# Patient Record
Sex: Male | Born: 1950 | Race: White | Hispanic: No | Marital: Married
Health system: Southern US, Community
[De-identification: ages and names within clinical notes are randomized; demographics above are authoritative.]

---

## 2002-03-21 ENCOUNTER — Encounter: Payer: Self-pay | Admitting: Family Medicine

## 2002-03-21 ENCOUNTER — Encounter: Admission: RE | Admit: 2002-03-21 | Discharge: 2002-03-21 | Payer: Self-pay | Admitting: Family Medicine

## 2006-08-25 ENCOUNTER — Encounter: Admission: RE | Admit: 2006-08-25 | Discharge: 2006-08-25 | Payer: Self-pay | Admitting: Family Medicine

## 2009-01-05 ENCOUNTER — Encounter: Admission: RE | Admit: 2009-01-05 | Discharge: 2009-01-05 | Payer: Self-pay | Admitting: Occupational Medicine

## 2009-06-01 ENCOUNTER — Ambulatory Visit: Payer: Self-pay | Admitting: Sports Medicine

## 2009-06-01 DIAGNOSIS — M2140 Flat foot [pes planus] (acquired), unspecified foot: Secondary | ICD-10-CM | POA: Insufficient documentation

## 2009-06-01 DIAGNOSIS — R269 Unspecified abnormalities of gait and mobility: Secondary | ICD-10-CM

## 2009-06-01 DIAGNOSIS — M722 Plantar fascial fibromatosis: Secondary | ICD-10-CM

## 2009-07-13 ENCOUNTER — Ambulatory Visit: Payer: Self-pay | Admitting: Sports Medicine

## 2010-06-15 NOTE — Letter (Signed)
Summary: Generic Letter  [facility]  [address]   [place], [place] [address]   Phone: [phone]  Fax: [phone]    07/13/2009 [name], MD [facility] Fax  [phone]  Dear [name]:  Re: your patient Anthony Griffith [address] [place], [place]  [address]  Anthony Griffith did much better with temporary inserts so we made him custom orthotics today.  He will return to you but we will be happy to see him as needed.      Sincerely,  Vincent Gros MD

## 2010-06-15 NOTE — Letter (Signed)
Summary: *Consult Note  [facility]  [address]   [place], [place] [address]   Phone: [phone]  Fax: [phone]    Re:    Anthony Griffith DOB:    [birth year] [name], MD [facility] 06/01/09   Dear [name]:    Thank you for requesting that we see the above patient for consultation. 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  A copy of the detailed office note will be sent under separate cover, for your review.  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  We will encourage him to continue standard PF exercises.  With his pronated gait I think he would be benefited by custom support if this trial works.   New Orders include:  1)  Consultation Level II [99242] 2)  Sports Insoles [L3510]  After today's visit, the patients current medications include: 1)  MOBIC 15 MG TABS (MELOXICAM) 1 tab by mouth daily 2)  PRAVACHOL 10 MG TABS (PRAVASTATIN SODIUM) Dose unknown   Thank you for this consultation.  If you have any further questions regarding the care of this patient, please do not hesitate to contact me @ [phone].  Thank you for this opportunity to look after your patient.  Sincerely,  Vincent Gros MD

## 2010-06-15 NOTE — Assessment & Plan Note (Signed)
Summary: NP,PLANTAR FASCIITIS,MC   Vital Signs:  Patient profile:   60 year old male Height:      69 inches Weight:      190 pounds BMI:     28.16 BP sitting:   139 / 89  Vitals Entered By: Lillia Pauls CMA (June 01, 2009 10:17 AM)  CC:  left foot pain.  History of Present Illness: 60 yo male referred from Dr. [name] for chronic LEFT foot pain.  Diagnosed as plantar fasciitis 2.5 years ago.  Was originally debilitating and required use of a cane.  Is currently 80% improved.  Describes pain and tightness in the plantar surface from heel to midfoot.  Gets better initially around midmorning, but by afternoon is painful again.  Works on hard concrete floors all day.   - Uses Mobic 15 mg by mouth daily, provides good relief.  - Does stretching exercises at home.  - Uses heel insert.  - Steroid injection 2.5 years ago without relief.  - Physical therapy 1.5-2 years ago, 95% better, then deteriorated again  - Previously referred to Dr. Roque Lias, podiatry, who used Voltaren gel which provided no relief.  Current Medications (verified): 1)  Mobic 15 Mg Tabs (Meloxicam) .Marland Kitchen.. 1 Tab By Mouth Daily 2)  Pravachol 10 Mg Tabs (Pravastatin Sodium) .... Dose Unknown  Allergies (verified): 1)  ! Codeine  Physical Exam  General:  Well-developed,well-nourished,in no acute distress; alert,appropriate and cooperative throughout examination Msk:  LEFT FOOT:  No tenderness to palpation.  No nodules.  Bilateral pes planus with LEFT calcaneal valgus.  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  LEFT heel wedge.  Strength and Stretching exercises given.  RTC 4-6 weeks for orthotics if does well with inserts. His updated medication list for this problem includes:    Mobic 15 Mg Tabs  (Meloxicam) .Marland Kitchen... 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  Orders: Sports Insoles [phone])  Problem # 3:  ABNORMALITY OF GAIT (ICD-781.2)  he not only has resting pronation but gets a lot of pronation with more on left I think he would benefit from fairly large correction of this if he can tolerate soft support trial  Orders: Sports Insoles (L3510)  Complete Medication List: 1)  Mobic 15 Mg Tabs (Meloxicam) .Marland Kitchen.. 1 tab by mouth daily 2)  Pravachol 10 Mg Tabs (Pravastatin sodium) .... Dose unknown

## 2010-06-15 NOTE — Assessment & Plan Note (Signed)
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 1 tab by mouth daily  cont exercises but also use orthotics as much as possible  Orders: Orthotic Materials, each unit (J1914)  Problem # 2:  ABNORMALITY OF GAIT (ICD-781.2)  Patient was fitted for a standard, cushioned, semi-rigid orthotic.  The orthotic was heated and the patient stood on the orthotic blank positioned on the orthotic stand. The patient was positioned in subtalar neutral position and 10 degrees of ankle dorsiflexion in a weight bearing stance. After completion of molding a stable based was applied to the orthotic blank.   The blank was ground to a stable position for weight bearing. 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 1 tab by mouth daily 2)  Pravachol 10 Mg Tabs (Pravastatin sodium) .... Dose unknown

## 2012-08-06 ENCOUNTER — Ambulatory Visit
Admission: RE | Admit: 2012-08-06 | Discharge: 2012-08-06 | Disposition: A | Discharge status: Home / Self Care | Payer: 59 | Source: Ambulatory Visit | Attending: Family Medicine | Admitting: Family Medicine

## 2012-08-06 ENCOUNTER — Other Ambulatory Visit: Payer: Self-pay | Admitting: Family Medicine

## 2012-08-06 DIAGNOSIS — R609 Edema, unspecified: Secondary | ICD-10-CM

## 2015-06-01 DIAGNOSIS — J329 Chronic sinusitis, unspecified: Secondary | ICD-10-CM | POA: Diagnosis not present

## 2015-10-08 ENCOUNTER — Other Ambulatory Visit: Payer: Self-pay | Admitting: Family Medicine

## 2015-10-08 DIAGNOSIS — M109 Gout, unspecified: Secondary | ICD-10-CM | POA: Diagnosis not present

## 2015-10-08 DIAGNOSIS — R7301 Impaired fasting glucose: Secondary | ICD-10-CM | POA: Diagnosis not present

## 2015-10-08 DIAGNOSIS — Z0000 Encounter for general adult medical examination without abnormal findings: Secondary | ICD-10-CM | POA: Diagnosis not present

## 2015-10-08 DIAGNOSIS — Z23 Encounter for immunization: Secondary | ICD-10-CM | POA: Diagnosis not present

## 2015-10-08 DIAGNOSIS — E669 Obesity, unspecified: Secondary | ICD-10-CM | POA: Diagnosis not present

## 2015-10-08 DIAGNOSIS — R1310 Dysphagia, unspecified: Secondary | ICD-10-CM | POA: Diagnosis not present

## 2015-10-08 DIAGNOSIS — Z136 Encounter for screening for cardiovascular disorders: Secondary | ICD-10-CM

## 2015-10-08 DIAGNOSIS — I10 Essential (primary) hypertension: Secondary | ICD-10-CM | POA: Diagnosis not present

## 2015-10-08 DIAGNOSIS — Z125 Encounter for screening for malignant neoplasm of prostate: Secondary | ICD-10-CM | POA: Diagnosis not present

## 2015-10-08 DIAGNOSIS — E782 Mixed hyperlipidemia: Secondary | ICD-10-CM | POA: Diagnosis not present

## 2015-10-08 DIAGNOSIS — H35039 Hypertensive retinopathy, unspecified eye: Secondary | ICD-10-CM | POA: Diagnosis not present

## 2015-10-16 ENCOUNTER — Ambulatory Visit
Admission: RE | Admit: 2015-10-16 | Discharge: 2015-10-16 | Disposition: A | Discharge status: Home / Self Care | Payer: Self-pay | Source: Ambulatory Visit | Attending: Family Medicine | Admitting: Family Medicine

## 2015-10-16 DIAGNOSIS — Z136 Encounter for screening for cardiovascular disorders: Secondary | ICD-10-CM | POA: Diagnosis not present

## 2016-05-23 DIAGNOSIS — I10 Essential (primary) hypertension: Secondary | ICD-10-CM | POA: Diagnosis not present

## 2016-05-23 DIAGNOSIS — R7301 Impaired fasting glucose: Secondary | ICD-10-CM | POA: Diagnosis not present

## 2016-05-23 DIAGNOSIS — E782 Mixed hyperlipidemia: Secondary | ICD-10-CM | POA: Diagnosis not present

## 2016-06-06 DIAGNOSIS — L03116 Cellulitis of left lower limb: Secondary | ICD-10-CM | POA: Diagnosis not present

## 2016-07-11 DIAGNOSIS — J329 Chronic sinusitis, unspecified: Secondary | ICD-10-CM | POA: Diagnosis not present

## 2016-07-11 DIAGNOSIS — R509 Fever, unspecified: Secondary | ICD-10-CM | POA: Diagnosis not present

## 2016-10-26 DIAGNOSIS — H524 Presbyopia: Secondary | ICD-10-CM | POA: Diagnosis not present

## 2016-10-26 DIAGNOSIS — H02055 Trichiasis without entropian left lower eyelid: Secondary | ICD-10-CM | POA: Diagnosis not present

## 2016-10-26 DIAGNOSIS — H25813 Combined forms of age-related cataract, bilateral: Secondary | ICD-10-CM | POA: Diagnosis not present

## 2016-10-26 DIAGNOSIS — H01001 Unspecified blepharitis right upper eyelid: Secondary | ICD-10-CM | POA: Diagnosis not present

## 2016-11-23 DIAGNOSIS — Z125 Encounter for screening for malignant neoplasm of prostate: Secondary | ICD-10-CM | POA: Diagnosis not present

## 2016-11-23 DIAGNOSIS — E782 Mixed hyperlipidemia: Secondary | ICD-10-CM | POA: Diagnosis not present

## 2016-11-23 DIAGNOSIS — Z23 Encounter for immunization: Secondary | ICD-10-CM | POA: Diagnosis not present

## 2016-11-23 DIAGNOSIS — I10 Essential (primary) hypertension: Secondary | ICD-10-CM | POA: Diagnosis not present

## 2016-11-23 DIAGNOSIS — M109 Gout, unspecified: Secondary | ICD-10-CM | POA: Diagnosis not present

## 2016-11-23 DIAGNOSIS — Z1159 Encounter for screening for other viral diseases: Secondary | ICD-10-CM | POA: Diagnosis not present

## 2016-11-23 DIAGNOSIS — Z0000 Encounter for general adult medical examination without abnormal findings: Secondary | ICD-10-CM | POA: Diagnosis not present

## 2016-11-23 DIAGNOSIS — N3281 Overactive bladder: Secondary | ICD-10-CM | POA: Diagnosis not present

## 2016-11-23 DIAGNOSIS — H35039 Hypertensive retinopathy, unspecified eye: Secondary | ICD-10-CM | POA: Diagnosis not present

## 2016-11-23 DIAGNOSIS — R7301 Impaired fasting glucose: Secondary | ICD-10-CM | POA: Diagnosis not present

## 2016-11-23 DIAGNOSIS — J309 Allergic rhinitis, unspecified: Secondary | ICD-10-CM | POA: Diagnosis not present

## 2017-06-14 DIAGNOSIS — I10 Essential (primary) hypertension: Secondary | ICD-10-CM | POA: Diagnosis not present

## 2017-06-14 DIAGNOSIS — E669 Obesity, unspecified: Secondary | ICD-10-CM | POA: Diagnosis not present

## 2017-06-14 DIAGNOSIS — Z6830 Body mass index (BMI) 30.0-30.9, adult: Secondary | ICD-10-CM | POA: Diagnosis not present

## 2017-06-14 DIAGNOSIS — R7301 Impaired fasting glucose: Secondary | ICD-10-CM | POA: Diagnosis not present

## 2017-06-14 DIAGNOSIS — M109 Gout, unspecified: Secondary | ICD-10-CM | POA: Diagnosis not present

## 2017-06-14 DIAGNOSIS — H35039 Hypertensive retinopathy, unspecified eye: Secondary | ICD-10-CM | POA: Diagnosis not present

## 2017-06-14 DIAGNOSIS — E782 Mixed hyperlipidemia: Secondary | ICD-10-CM | POA: Diagnosis not present

## 2017-06-14 DIAGNOSIS — N3281 Overactive bladder: Secondary | ICD-10-CM | POA: Diagnosis not present

## 2017-12-20 DIAGNOSIS — R5383 Other fatigue: Secondary | ICD-10-CM | POA: Diagnosis not present

## 2017-12-20 DIAGNOSIS — M109 Gout, unspecified: Secondary | ICD-10-CM | POA: Diagnosis not present

## 2017-12-20 DIAGNOSIS — E782 Mixed hyperlipidemia: Secondary | ICD-10-CM | POA: Diagnosis not present

## 2017-12-20 DIAGNOSIS — H35039 Hypertensive retinopathy, unspecified eye: Secondary | ICD-10-CM | POA: Diagnosis not present

## 2017-12-20 DIAGNOSIS — Z0000 Encounter for general adult medical examination without abnormal findings: Secondary | ICD-10-CM | POA: Diagnosis not present

## 2017-12-20 DIAGNOSIS — J309 Allergic rhinitis, unspecified: Secondary | ICD-10-CM | POA: Diagnosis not present

## 2017-12-20 DIAGNOSIS — R7301 Impaired fasting glucose: Secondary | ICD-10-CM | POA: Diagnosis not present

## 2017-12-20 DIAGNOSIS — Z125 Encounter for screening for malignant neoplasm of prostate: Secondary | ICD-10-CM | POA: Diagnosis not present

## 2017-12-20 DIAGNOSIS — I10 Essential (primary) hypertension: Secondary | ICD-10-CM | POA: Diagnosis not present

## 2017-12-20 DIAGNOSIS — Z23 Encounter for immunization: Secondary | ICD-10-CM | POA: Diagnosis not present

## 2017-12-20 DIAGNOSIS — N529 Male erectile dysfunction, unspecified: Secondary | ICD-10-CM | POA: Diagnosis not present

## 2018-01-01 DIAGNOSIS — H02055 Trichiasis without entropian left lower eyelid: Secondary | ICD-10-CM | POA: Diagnosis not present

## 2018-01-01 DIAGNOSIS — E119 Type 2 diabetes mellitus without complications: Secondary | ICD-10-CM | POA: Diagnosis not present

## 2018-01-01 DIAGNOSIS — H25813 Combined forms of age-related cataract, bilateral: Secondary | ICD-10-CM | POA: Diagnosis not present

## 2018-01-01 DIAGNOSIS — H5203 Hypermetropia, bilateral: Secondary | ICD-10-CM | POA: Diagnosis not present

## 2018-07-03 DIAGNOSIS — R7301 Impaired fasting glucose: Secondary | ICD-10-CM | POA: Diagnosis not present

## 2018-07-03 DIAGNOSIS — E782 Mixed hyperlipidemia: Secondary | ICD-10-CM | POA: Diagnosis not present

## 2018-07-03 DIAGNOSIS — H35039 Hypertensive retinopathy, unspecified eye: Secondary | ICD-10-CM | POA: Diagnosis not present

## 2018-07-03 DIAGNOSIS — L719 Rosacea, unspecified: Secondary | ICD-10-CM | POA: Diagnosis not present

## 2018-07-03 DIAGNOSIS — I10 Essential (primary) hypertension: Secondary | ICD-10-CM | POA: Diagnosis not present

## 2018-07-03 DIAGNOSIS — A09 Infectious gastroenteritis and colitis, unspecified: Secondary | ICD-10-CM | POA: Diagnosis not present

## 2018-12-31 DIAGNOSIS — Z125 Encounter for screening for malignant neoplasm of prostate: Secondary | ICD-10-CM | POA: Diagnosis not present

## 2018-12-31 DIAGNOSIS — R7301 Impaired fasting glucose: Secondary | ICD-10-CM | POA: Diagnosis not present

## 2018-12-31 DIAGNOSIS — I10 Essential (primary) hypertension: Secondary | ICD-10-CM | POA: Diagnosis not present

## 2018-12-31 DIAGNOSIS — E782 Mixed hyperlipidemia: Secondary | ICD-10-CM | POA: Diagnosis not present

## 2019-01-03 DIAGNOSIS — H35039 Hypertensive retinopathy, unspecified eye: Secondary | ICD-10-CM | POA: Diagnosis not present

## 2019-01-03 DIAGNOSIS — Z7189 Other specified counseling: Secondary | ICD-10-CM | POA: Diagnosis not present

## 2019-01-03 DIAGNOSIS — I10 Essential (primary) hypertension: Secondary | ICD-10-CM | POA: Diagnosis not present

## 2019-01-03 DIAGNOSIS — Z125 Encounter for screening for malignant neoplasm of prostate: Secondary | ICD-10-CM | POA: Diagnosis not present

## 2019-01-03 DIAGNOSIS — J309 Allergic rhinitis, unspecified: Secondary | ICD-10-CM | POA: Diagnosis not present

## 2019-01-03 DIAGNOSIS — R7301 Impaired fasting glucose: Secondary | ICD-10-CM | POA: Diagnosis not present

## 2019-01-03 DIAGNOSIS — M109 Gout, unspecified: Secondary | ICD-10-CM | POA: Diagnosis not present

## 2019-01-03 DIAGNOSIS — R945 Abnormal results of liver function studies: Secondary | ICD-10-CM | POA: Diagnosis not present

## 2019-01-03 DIAGNOSIS — Z0000 Encounter for general adult medical examination without abnormal findings: Secondary | ICD-10-CM | POA: Diagnosis not present

## 2019-01-03 DIAGNOSIS — E782 Mixed hyperlipidemia: Secondary | ICD-10-CM | POA: Diagnosis not present

## 2019-01-03 DIAGNOSIS — M1990 Unspecified osteoarthritis, unspecified site: Secondary | ICD-10-CM | POA: Diagnosis not present

## 2019-01-03 DIAGNOSIS — N529 Male erectile dysfunction, unspecified: Secondary | ICD-10-CM | POA: Diagnosis not present

## 2019-01-17 DIAGNOSIS — R945 Abnormal results of liver function studies: Secondary | ICD-10-CM | POA: Diagnosis not present

## 2019-01-17 DIAGNOSIS — R740 Nonspecific elevation of levels of transaminase and lactic acid dehydrogenase [LDH]: Secondary | ICD-10-CM | POA: Diagnosis not present

## 2019-01-23 ENCOUNTER — Other Ambulatory Visit: Payer: Self-pay | Admitting: Family Medicine

## 2019-01-23 DIAGNOSIS — R7989 Other specified abnormal findings of blood chemistry: Secondary | ICD-10-CM

## 2019-01-29 ENCOUNTER — Ambulatory Visit
Admission: RE | Admit: 2019-01-29 | Discharge: 2019-01-29 | Disposition: A | Discharge status: Home / Self Care | Payer: PPO | Source: Ambulatory Visit | Attending: Family Medicine | Admitting: Family Medicine

## 2019-01-29 DIAGNOSIS — R7989 Other specified abnormal findings of blood chemistry: Secondary | ICD-10-CM | POA: Diagnosis not present

## 2019-02-14 DIAGNOSIS — R945 Abnormal results of liver function studies: Secondary | ICD-10-CM | POA: Diagnosis not present

## 2019-02-26 DIAGNOSIS — M25561 Pain in right knee: Secondary | ICD-10-CM | POA: Diagnosis not present

## 2019-02-26 DIAGNOSIS — M25562 Pain in left knee: Secondary | ICD-10-CM | POA: Diagnosis not present

## 2019-02-28 DIAGNOSIS — M25562 Pain in left knee: Secondary | ICD-10-CM | POA: Diagnosis not present

## 2019-02-28 DIAGNOSIS — M25561 Pain in right knee: Secondary | ICD-10-CM | POA: Diagnosis not present

## 2019-03-04 DIAGNOSIS — M25562 Pain in left knee: Secondary | ICD-10-CM | POA: Diagnosis not present

## 2019-03-04 DIAGNOSIS — M25561 Pain in right knee: Secondary | ICD-10-CM | POA: Diagnosis not present

## 2019-03-06 DIAGNOSIS — M25561 Pain in right knee: Secondary | ICD-10-CM | POA: Diagnosis not present

## 2019-03-06 DIAGNOSIS — M25562 Pain in left knee: Secondary | ICD-10-CM | POA: Diagnosis not present

## 2019-03-11 DIAGNOSIS — M25562 Pain in left knee: Secondary | ICD-10-CM | POA: Diagnosis not present

## 2019-03-11 DIAGNOSIS — M25561 Pain in right knee: Secondary | ICD-10-CM | POA: Diagnosis not present

## 2019-03-14 DIAGNOSIS — M25561 Pain in right knee: Secondary | ICD-10-CM | POA: Diagnosis not present

## 2019-03-14 DIAGNOSIS — M25562 Pain in left knee: Secondary | ICD-10-CM | POA: Diagnosis not present

## 2019-04-02 DIAGNOSIS — H00019 Hordeolum externum unspecified eye, unspecified eyelid: Secondary | ICD-10-CM | POA: Diagnosis not present

## 2019-04-02 DIAGNOSIS — R945 Abnormal results of liver function studies: Secondary | ICD-10-CM | POA: Diagnosis not present

## 2019-04-23 DIAGNOSIS — H0015 Chalazion left lower eyelid: Secondary | ICD-10-CM | POA: Diagnosis not present

## 2019-04-23 DIAGNOSIS — H0011 Chalazion right upper eyelid: Secondary | ICD-10-CM | POA: Diagnosis not present

## 2019-05-07 DIAGNOSIS — H0011 Chalazion right upper eyelid: Secondary | ICD-10-CM | POA: Diagnosis not present

## 2019-05-07 DIAGNOSIS — H0015 Chalazion left lower eyelid: Secondary | ICD-10-CM | POA: Diagnosis not present

## 2019-07-01 DIAGNOSIS — H0015 Chalazion left lower eyelid: Secondary | ICD-10-CM | POA: Diagnosis not present

## 2019-07-01 DIAGNOSIS — H0011 Chalazion right upper eyelid: Secondary | ICD-10-CM | POA: Diagnosis not present

## 2019-07-02 DIAGNOSIS — H0015 Chalazion left lower eyelid: Secondary | ICD-10-CM | POA: Diagnosis not present

## 2019-07-11 DIAGNOSIS — H0011 Chalazion right upper eyelid: Secondary | ICD-10-CM | POA: Diagnosis not present

## 2019-07-11 DIAGNOSIS — I10 Essential (primary) hypertension: Secondary | ICD-10-CM | POA: Diagnosis not present

## 2019-07-11 DIAGNOSIS — Z7189 Other specified counseling: Secondary | ICD-10-CM | POA: Diagnosis not present

## 2019-07-11 DIAGNOSIS — R7301 Impaired fasting glucose: Secondary | ICD-10-CM | POA: Diagnosis not present

## 2019-07-11 DIAGNOSIS — E782 Mixed hyperlipidemia: Secondary | ICD-10-CM | POA: Diagnosis not present

## 2019-07-11 DIAGNOSIS — M109 Gout, unspecified: Secondary | ICD-10-CM | POA: Diagnosis not present

## 2019-07-11 DIAGNOSIS — H00019 Hordeolum externum unspecified eye, unspecified eyelid: Secondary | ICD-10-CM | POA: Diagnosis not present

## 2019-07-17 DIAGNOSIS — R7309 Other abnormal glucose: Secondary | ICD-10-CM | POA: Diagnosis not present

## 2019-07-17 DIAGNOSIS — E782 Mixed hyperlipidemia: Secondary | ICD-10-CM | POA: Diagnosis not present

## 2019-07-17 DIAGNOSIS — I10 Essential (primary) hypertension: Secondary | ICD-10-CM | POA: Diagnosis not present

## 2019-07-27 ENCOUNTER — Ambulatory Visit: Payer: PPO

## 2019-10-21 DIAGNOSIS — E782 Mixed hyperlipidemia: Secondary | ICD-10-CM | POA: Diagnosis not present

## 2020-01-01 DIAGNOSIS — N529 Male erectile dysfunction, unspecified: Secondary | ICD-10-CM | POA: Diagnosis not present

## 2020-01-01 DIAGNOSIS — M109 Gout, unspecified: Secondary | ICD-10-CM | POA: Diagnosis not present

## 2020-01-01 DIAGNOSIS — R7309 Other abnormal glucose: Secondary | ICD-10-CM | POA: Diagnosis not present

## 2020-01-01 DIAGNOSIS — Z0000 Encounter for general adult medical examination without abnormal findings: Secondary | ICD-10-CM | POA: Diagnosis not present

## 2020-01-01 DIAGNOSIS — Z7189 Other specified counseling: Secondary | ICD-10-CM | POA: Diagnosis not present

## 2020-01-01 DIAGNOSIS — H35039 Hypertensive retinopathy, unspecified eye: Secondary | ICD-10-CM | POA: Diagnosis not present

## 2020-01-01 DIAGNOSIS — Z125 Encounter for screening for malignant neoplasm of prostate: Secondary | ICD-10-CM | POA: Diagnosis not present

## 2020-01-01 DIAGNOSIS — M1990 Unspecified osteoarthritis, unspecified site: Secondary | ICD-10-CM | POA: Diagnosis not present

## 2020-01-01 DIAGNOSIS — J309 Allergic rhinitis, unspecified: Secondary | ICD-10-CM | POA: Diagnosis not present

## 2020-01-01 DIAGNOSIS — R0981 Nasal congestion: Secondary | ICD-10-CM | POA: Diagnosis not present

## 2020-01-01 DIAGNOSIS — E782 Mixed hyperlipidemia: Secondary | ICD-10-CM | POA: Diagnosis not present

## 2020-01-01 DIAGNOSIS — I10 Essential (primary) hypertension: Secondary | ICD-10-CM | POA: Diagnosis not present

## 2020-02-04 DIAGNOSIS — K5792 Diverticulitis of intestine, part unspecified, without perforation or abscess without bleeding: Secondary | ICD-10-CM | POA: Diagnosis not present

## 2020-06-04 DIAGNOSIS — H0100A Unspecified blepharitis right eye, upper and lower eyelids: Secondary | ICD-10-CM | POA: Diagnosis not present

## 2020-06-04 DIAGNOSIS — E109 Type 1 diabetes mellitus without complications: Secondary | ICD-10-CM | POA: Diagnosis not present

## 2020-06-04 DIAGNOSIS — H52203 Unspecified astigmatism, bilateral: Secondary | ICD-10-CM | POA: Diagnosis not present

## 2020-06-04 DIAGNOSIS — H25813 Combined forms of age-related cataract, bilateral: Secondary | ICD-10-CM | POA: Diagnosis not present

## 2020-10-02 DIAGNOSIS — E782 Mixed hyperlipidemia: Secondary | ICD-10-CM | POA: Diagnosis not present

## 2020-10-02 DIAGNOSIS — I10 Essential (primary) hypertension: Secondary | ICD-10-CM | POA: Diagnosis not present

## 2020-10-02 DIAGNOSIS — M79673 Pain in unspecified foot: Secondary | ICD-10-CM | POA: Diagnosis not present

## 2020-10-02 DIAGNOSIS — R7301 Impaired fasting glucose: Secondary | ICD-10-CM | POA: Diagnosis not present

## 2021-01-01 ENCOUNTER — Other Ambulatory Visit: Payer: Self-pay | Admitting: Family Medicine

## 2021-01-01 DIAGNOSIS — J309 Allergic rhinitis, unspecified: Secondary | ICD-10-CM | POA: Diagnosis not present

## 2021-01-01 DIAGNOSIS — R7301 Impaired fasting glucose: Secondary | ICD-10-CM | POA: Diagnosis not present

## 2021-01-01 DIAGNOSIS — Z0000 Encounter for general adult medical examination without abnormal findings: Secondary | ICD-10-CM | POA: Diagnosis not present

## 2021-01-01 DIAGNOSIS — M109 Gout, unspecified: Secondary | ICD-10-CM | POA: Diagnosis not present

## 2021-01-01 DIAGNOSIS — R1310 Dysphagia, unspecified: Secondary | ICD-10-CM | POA: Diagnosis not present

## 2021-01-01 DIAGNOSIS — I10 Essential (primary) hypertension: Secondary | ICD-10-CM | POA: Diagnosis not present

## 2021-01-01 DIAGNOSIS — N529 Male erectile dysfunction, unspecified: Secondary | ICD-10-CM | POA: Diagnosis not present

## 2021-01-01 DIAGNOSIS — H35039 Hypertensive retinopathy, unspecified eye: Secondary | ICD-10-CM | POA: Diagnosis not present

## 2021-01-01 DIAGNOSIS — M1990 Unspecified osteoarthritis, unspecified site: Secondary | ICD-10-CM | POA: Diagnosis not present

## 2021-01-01 DIAGNOSIS — Z125 Encounter for screening for malignant neoplasm of prostate: Secondary | ICD-10-CM | POA: Diagnosis not present

## 2021-01-01 DIAGNOSIS — E782 Mixed hyperlipidemia: Secondary | ICD-10-CM | POA: Diagnosis not present

## 2021-01-06 DIAGNOSIS — M2141 Flat foot [pes planus] (acquired), right foot: Secondary | ICD-10-CM | POA: Diagnosis not present

## 2021-01-06 DIAGNOSIS — M2142 Flat foot [pes planus] (acquired), left foot: Secondary | ICD-10-CM | POA: Diagnosis not present

## 2021-01-08 ENCOUNTER — Ambulatory Visit
Admission: RE | Admit: 2021-01-08 | Discharge: 2021-01-08 | Disposition: A | Discharge status: Home / Self Care | Payer: PPO | Source: Ambulatory Visit | Attending: Family Medicine | Admitting: Family Medicine

## 2021-01-08 DIAGNOSIS — R1310 Dysphagia, unspecified: Secondary | ICD-10-CM | POA: Diagnosis not present

## 2021-01-08 DIAGNOSIS — K224 Dyskinesia of esophagus: Secondary | ICD-10-CM | POA: Diagnosis not present

## 2021-02-01 DIAGNOSIS — M79672 Pain in left foot: Secondary | ICD-10-CM | POA: Diagnosis not present

## 2021-02-01 DIAGNOSIS — M79671 Pain in right foot: Secondary | ICD-10-CM | POA: Diagnosis not present

## 2021-03-15 DIAGNOSIS — H34812 Central retinal vein occlusion, left eye, with macular edema: Secondary | ICD-10-CM | POA: Diagnosis not present

## 2021-03-17 DIAGNOSIS — H35033 Hypertensive retinopathy, bilateral: Secondary | ICD-10-CM | POA: Diagnosis not present

## 2021-03-17 DIAGNOSIS — H53452 Other localized visual field defect, left eye: Secondary | ICD-10-CM | POA: Diagnosis not present

## 2021-03-17 DIAGNOSIS — H3562 Retinal hemorrhage, left eye: Secondary | ICD-10-CM | POA: Diagnosis not present

## 2021-03-17 DIAGNOSIS — H35722 Serous detachment of retinal pigment epithelium, left eye: Secondary | ICD-10-CM | POA: Diagnosis not present

## 2021-03-17 DIAGNOSIS — H34832 Tributary (branch) retinal vein occlusion, left eye, with macular edema: Secondary | ICD-10-CM | POA: Diagnosis not present

## 2021-03-17 DIAGNOSIS — H53132 Sudden visual loss, left eye: Secondary | ICD-10-CM | POA: Diagnosis not present

## 2021-03-18 DIAGNOSIS — H34832 Tributary (branch) retinal vein occlusion, left eye, with macular edema: Secondary | ICD-10-CM | POA: Diagnosis not present

## 2021-04-16 DIAGNOSIS — H34832 Tributary (branch) retinal vein occlusion, left eye, with macular edema: Secondary | ICD-10-CM | POA: Diagnosis not present

## 2021-05-21 DIAGNOSIS — H34832 Tributary (branch) retinal vein occlusion, left eye, with macular edema: Secondary | ICD-10-CM | POA: Diagnosis not present

## 2021-06-08 DIAGNOSIS — H5202 Hypermetropia, left eye: Secondary | ICD-10-CM | POA: Diagnosis not present

## 2021-06-08 DIAGNOSIS — H2513 Age-related nuclear cataract, bilateral: Secondary | ICD-10-CM | POA: Diagnosis not present

## 2021-06-08 DIAGNOSIS — H34812 Central retinal vein occlusion, left eye, with macular edema: Secondary | ICD-10-CM | POA: Diagnosis not present

## 2021-06-24 DIAGNOSIS — H34832 Tributary (branch) retinal vein occlusion, left eye, with macular edema: Secondary | ICD-10-CM | POA: Diagnosis not present

## 2021-07-06 IMAGING — US US ABDOMEN LIMITED
1 series · 14 of 25 positions shown · non-contrast
Comparison: None.

CLINICAL DATA: Elevated LFTs.

EXAM:
ULTRASOUND ABDOMEN LIMITED RIGHT UPPER QUADRANT

[Series 1: us abdomen limited · 0.18mm/px · 14 of 46 slices shown]
[im 1/46]
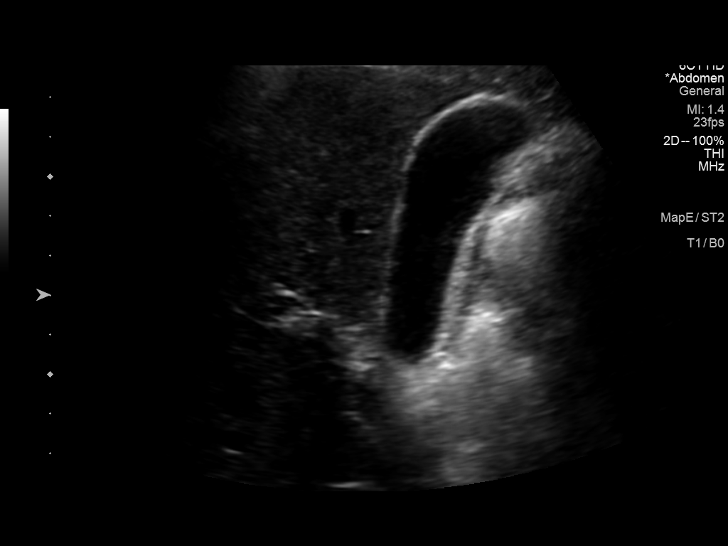
[im 4/46]
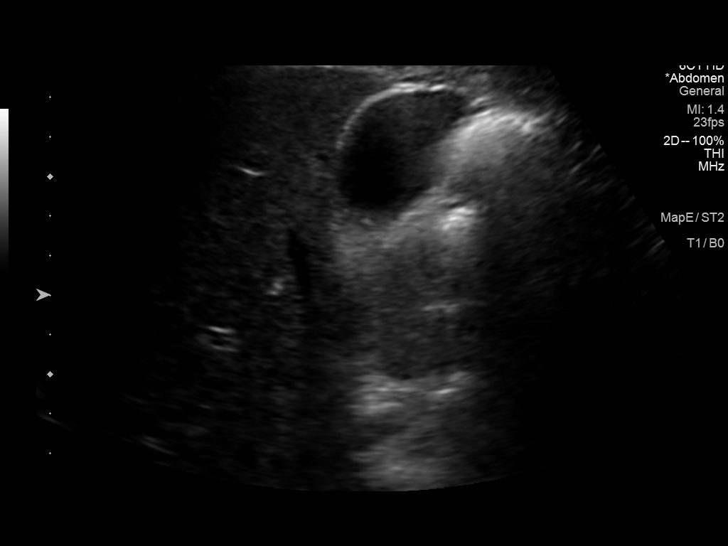
[im 8/46]
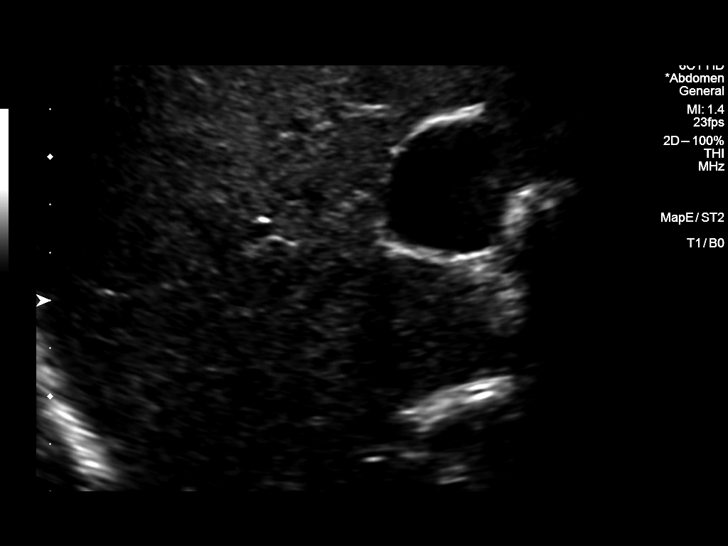
[im 12/46]
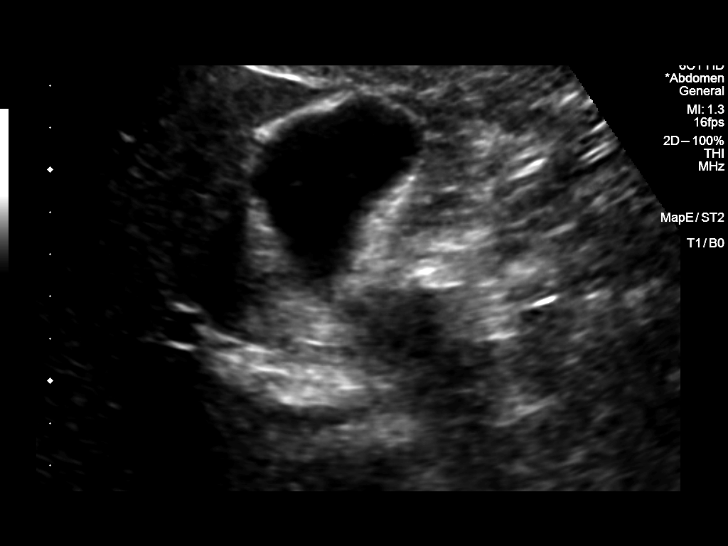
[im 16/46]
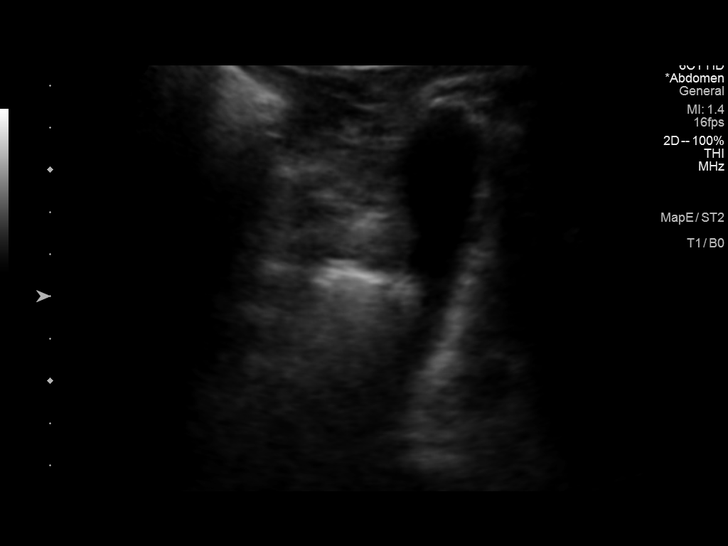
[im 17/46]
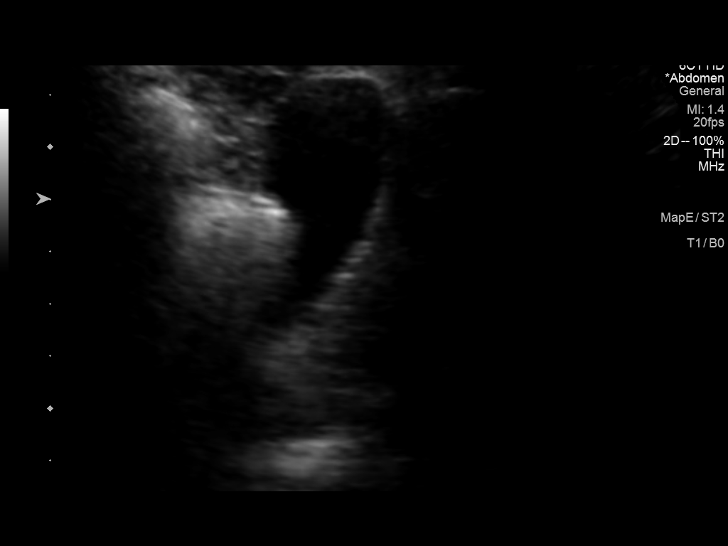
[im 21/46]
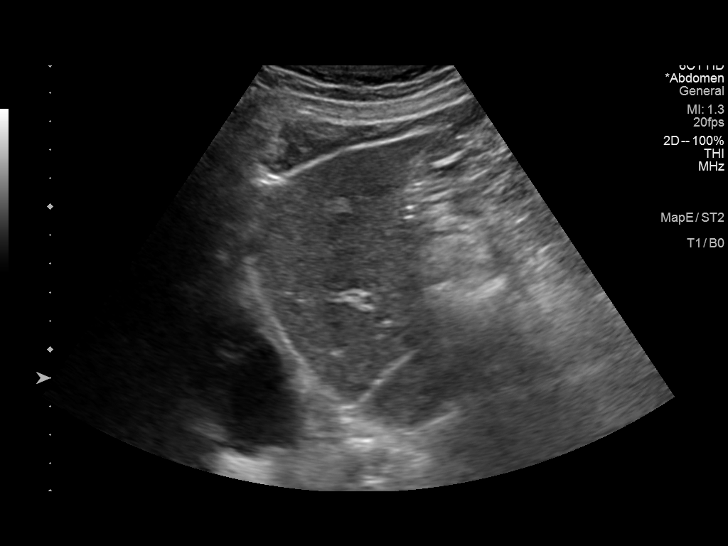
[im 25/46]
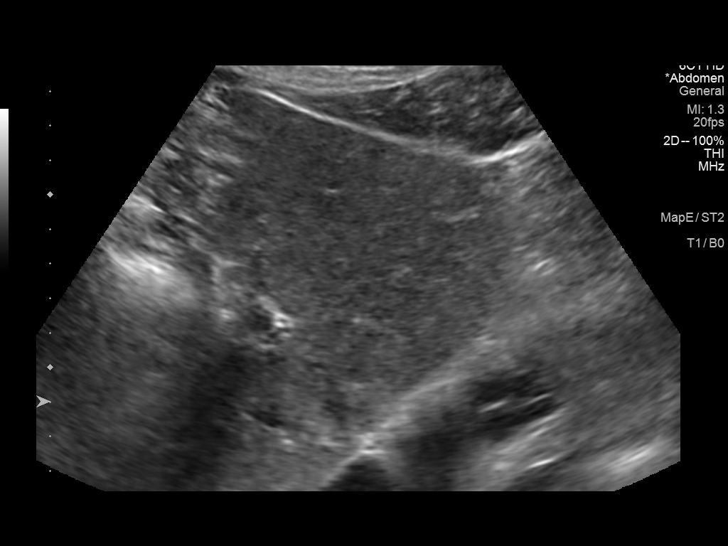
[im 29/46]
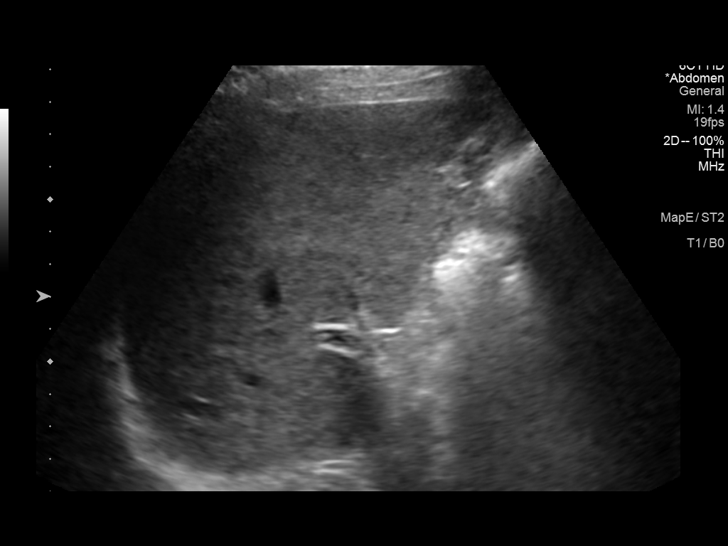
[im 31/46]
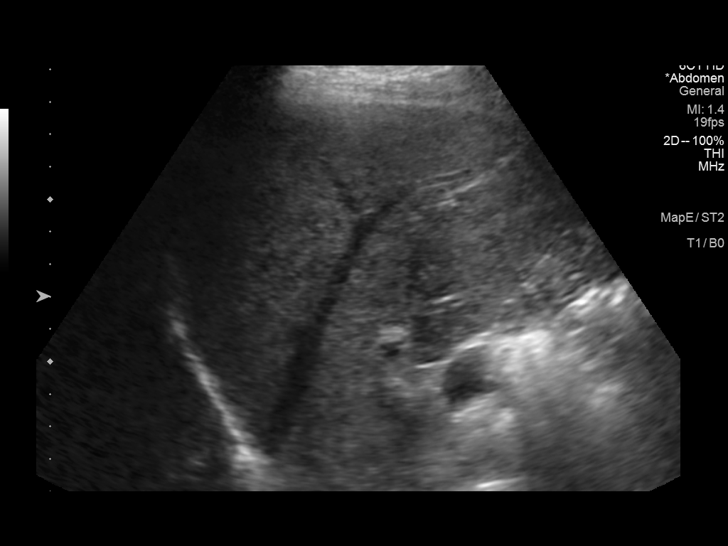
[im 34/46]
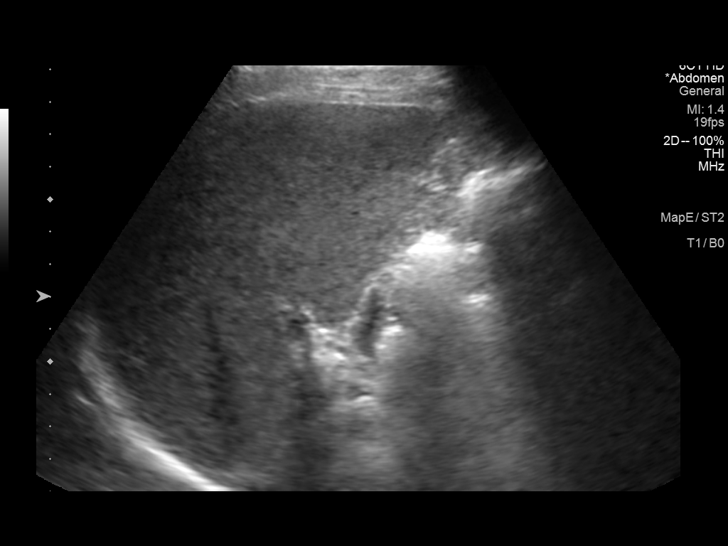
[im 38/46]
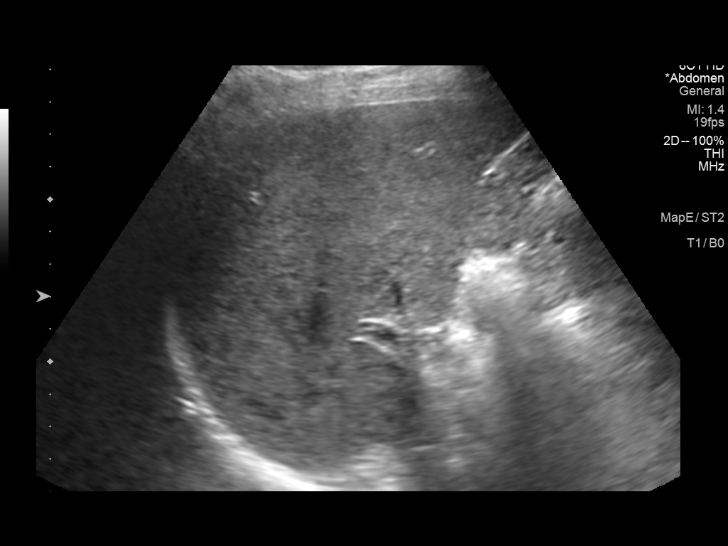
[im 42/46]
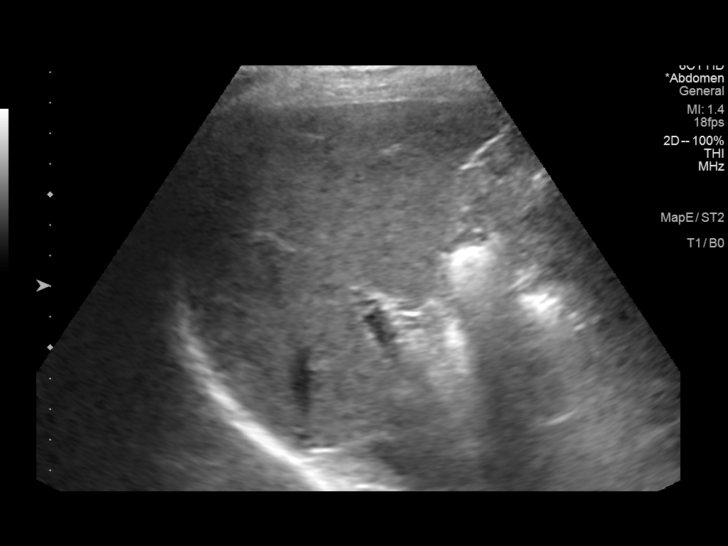
[im 46/46]
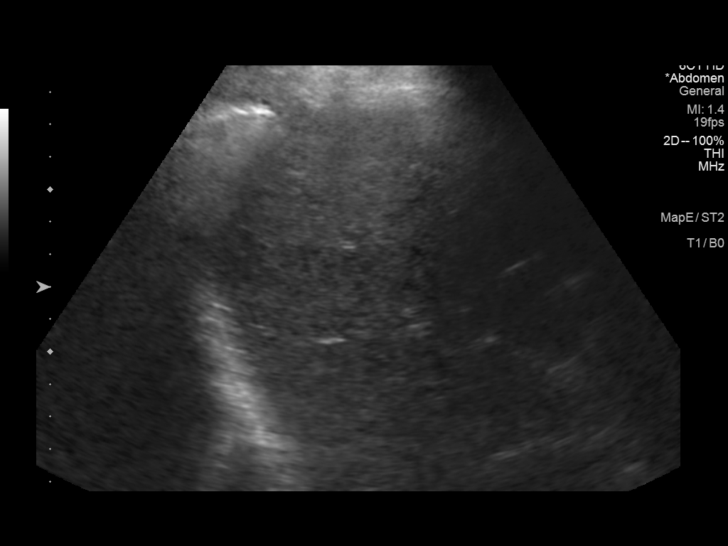

[14 of 25 positions shown; findings below may reference images not displayed]

FINDINGS: Gallbladder:

No gallstones or wall thickening visualized. No sonographic Murphy
sign noted by sonographer.

Common bile duct:

Diameter: 3.9 mm

Liver:

No focal lesion identified. Within normal limits in parenchymal
echogenicity. Portal vein is patent on color Doppler imaging with
normal direction of blood flow towards the liver.

Other: None.
IMPRESSION: No sonographic finding to explain the patient's elevated liver
enzymes.

## 2021-07-09 DIAGNOSIS — E782 Mixed hyperlipidemia: Secondary | ICD-10-CM | POA: Diagnosis not present

## 2021-07-09 DIAGNOSIS — I10 Essential (primary) hypertension: Secondary | ICD-10-CM | POA: Diagnosis not present

## 2021-07-09 DIAGNOSIS — R7301 Impaired fasting glucose: Secondary | ICD-10-CM | POA: Diagnosis not present

## 2021-07-28 DIAGNOSIS — H34832 Tributary (branch) retinal vein occlusion, left eye, with macular edema: Secondary | ICD-10-CM | POA: Diagnosis not present

## 2021-08-31 DIAGNOSIS — H34832 Tributary (branch) retinal vein occlusion, left eye, with macular edema: Secondary | ICD-10-CM | POA: Diagnosis not present

## 2021-10-04 DIAGNOSIS — H3509 Other intraretinal microvascular abnormalities: Secondary | ICD-10-CM | POA: Diagnosis not present

## 2021-10-04 DIAGNOSIS — H3562 Retinal hemorrhage, left eye: Secondary | ICD-10-CM | POA: Diagnosis not present

## 2021-10-04 DIAGNOSIS — H3582 Retinal ischemia: Secondary | ICD-10-CM | POA: Diagnosis not present

## 2021-10-04 DIAGNOSIS — H25813 Combined forms of age-related cataract, bilateral: Secondary | ICD-10-CM | POA: Diagnosis not present

## 2021-10-04 DIAGNOSIS — H34832 Tributary (branch) retinal vein occlusion, left eye, with macular edema: Secondary | ICD-10-CM | POA: Diagnosis not present

## 2021-10-06 DIAGNOSIS — H34832 Tributary (branch) retinal vein occlusion, left eye, with macular edema: Secondary | ICD-10-CM | POA: Diagnosis not present

## 2021-10-20 DIAGNOSIS — H34832 Tributary (branch) retinal vein occlusion, left eye, with macular edema: Secondary | ICD-10-CM | POA: Diagnosis not present

## 2021-11-10 DIAGNOSIS — H34832 Tributary (branch) retinal vein occlusion, left eye, with macular edema: Secondary | ICD-10-CM | POA: Diagnosis not present

## 2021-12-15 DIAGNOSIS — H34832 Tributary (branch) retinal vein occlusion, left eye, with macular edema: Secondary | ICD-10-CM | POA: Diagnosis not present

## 2022-01-05 DIAGNOSIS — M109 Gout, unspecified: Secondary | ICD-10-CM | POA: Diagnosis not present

## 2022-01-05 DIAGNOSIS — N529 Male erectile dysfunction, unspecified: Secondary | ICD-10-CM | POA: Diagnosis not present

## 2022-01-05 DIAGNOSIS — M1990 Unspecified osteoarthritis, unspecified site: Secondary | ICD-10-CM | POA: Diagnosis not present

## 2022-01-05 DIAGNOSIS — Z125 Encounter for screening for malignant neoplasm of prostate: Secondary | ICD-10-CM | POA: Diagnosis not present

## 2022-01-05 DIAGNOSIS — H35039 Hypertensive retinopathy, unspecified eye: Secondary | ICD-10-CM | POA: Diagnosis not present

## 2022-01-05 DIAGNOSIS — I10 Essential (primary) hypertension: Secondary | ICD-10-CM | POA: Diagnosis not present

## 2022-01-05 DIAGNOSIS — J309 Allergic rhinitis, unspecified: Secondary | ICD-10-CM | POA: Diagnosis not present

## 2022-01-05 DIAGNOSIS — E782 Mixed hyperlipidemia: Secondary | ICD-10-CM | POA: Diagnosis not present

## 2022-01-05 DIAGNOSIS — Z0000 Encounter for general adult medical examination without abnormal findings: Secondary | ICD-10-CM | POA: Diagnosis not present

## 2022-01-05 DIAGNOSIS — R7301 Impaired fasting glucose: Secondary | ICD-10-CM | POA: Diagnosis not present

## 2022-01-05 DIAGNOSIS — Z23 Encounter for immunization: Secondary | ICD-10-CM | POA: Diagnosis not present

## 2022-01-31 DIAGNOSIS — H34832 Tributary (branch) retinal vein occlusion, left eye, with macular edema: Secondary | ICD-10-CM | POA: Diagnosis not present

## 2022-03-16 DIAGNOSIS — H34832 Tributary (branch) retinal vein occlusion, left eye, with macular edema: Secondary | ICD-10-CM | POA: Diagnosis not present

## 2022-03-21 DIAGNOSIS — H25043 Posterior subcapsular polar age-related cataract, bilateral: Secondary | ICD-10-CM | POA: Diagnosis not present

## 2022-03-21 DIAGNOSIS — H34832 Tributary (branch) retinal vein occlusion, left eye, with macular edema: Secondary | ICD-10-CM | POA: Diagnosis not present

## 2022-03-21 DIAGNOSIS — H35032 Hypertensive retinopathy, left eye: Secondary | ICD-10-CM | POA: Diagnosis not present

## 2022-03-21 DIAGNOSIS — H3582 Retinal ischemia: Secondary | ICD-10-CM | POA: Diagnosis not present

## 2022-03-21 DIAGNOSIS — H35012 Changes in retinal vascular appearance, left eye: Secondary | ICD-10-CM | POA: Diagnosis not present

## 2022-05-04 DIAGNOSIS — H34832 Tributary (branch) retinal vein occlusion, left eye, with macular edema: Secondary | ICD-10-CM | POA: Diagnosis not present

## 2022-05-06 DIAGNOSIS — H31092 Other chorioretinal scars, left eye: Secondary | ICD-10-CM | POA: Diagnosis not present

## 2022-05-06 DIAGNOSIS — H43392 Other vitreous opacities, left eye: Secondary | ICD-10-CM | POA: Diagnosis not present

## 2022-05-06 DIAGNOSIS — H43812 Vitreous degeneration, left eye: Secondary | ICD-10-CM | POA: Diagnosis not present

## 2022-05-06 DIAGNOSIS — H34832 Tributary (branch) retinal vein occlusion, left eye, with macular edema: Secondary | ICD-10-CM | POA: Diagnosis not present

## 2022-05-06 DIAGNOSIS — H3582 Retinal ischemia: Secondary | ICD-10-CM | POA: Diagnosis not present

## 2022-06-20 DIAGNOSIS — H2513 Age-related nuclear cataract, bilateral: Secondary | ICD-10-CM | POA: Diagnosis not present

## 2022-06-20 DIAGNOSIS — H349 Unspecified retinal vascular occlusion: Secondary | ICD-10-CM | POA: Diagnosis not present

## 2022-06-20 DIAGNOSIS — H524 Presbyopia: Secondary | ICD-10-CM | POA: Diagnosis not present

## 2022-06-29 DIAGNOSIS — H34832 Tributary (branch) retinal vein occlusion, left eye, with macular edema: Secondary | ICD-10-CM | POA: Diagnosis not present

## 2022-07-07 DIAGNOSIS — H3582 Retinal ischemia: Secondary | ICD-10-CM | POA: Diagnosis not present

## 2022-07-07 DIAGNOSIS — H43812 Vitreous degeneration, left eye: Secondary | ICD-10-CM | POA: Diagnosis not present

## 2022-07-07 DIAGNOSIS — H43392 Other vitreous opacities, left eye: Secondary | ICD-10-CM | POA: Diagnosis not present

## 2022-07-07 DIAGNOSIS — H34832 Tributary (branch) retinal vein occlusion, left eye, with macular edema: Secondary | ICD-10-CM | POA: Diagnosis not present

## 2022-07-07 DIAGNOSIS — H25813 Combined forms of age-related cataract, bilateral: Secondary | ICD-10-CM | POA: Diagnosis not present

## 2022-07-13 DIAGNOSIS — E782 Mixed hyperlipidemia: Secondary | ICD-10-CM | POA: Diagnosis not present

## 2022-07-13 DIAGNOSIS — R7301 Impaired fasting glucose: Secondary | ICD-10-CM | POA: Diagnosis not present

## 2022-07-13 DIAGNOSIS — I10 Essential (primary) hypertension: Secondary | ICD-10-CM | POA: Diagnosis not present

## 2022-07-20 ENCOUNTER — Ambulatory Visit: Payer: PPO | Admitting: Podiatry

## 2022-07-20 ENCOUNTER — Encounter: Payer: Self-pay | Admitting: Podiatry

## 2022-07-20 DIAGNOSIS — L600 Ingrowing nail: Secondary | ICD-10-CM | POA: Diagnosis not present

## 2022-07-20 MED ORDER — NEOMYCIN-POLYMYXIN-HC 3.5-10000-1 OT SUSP: OTIC | 0 refills | Status: AC

## 2022-07-20 NOTE — Patient Instructions (Signed)

## 2022-07-20 NOTE — Progress Notes (Signed)
  Subjective:  Patient ID: Anthony Griffith, male    DOB: 1951-03-04,  MRN: [id number]  Chief Complaint  Patient presents with   Nail Problem    New Patient   Ingrown toenail Referring Provider: Mayra Neer    72 y.o. male presents with the above complaint. History confirmed with patient.  Reports has been there for some time the nail has shown signs of fungus and the nail has been misshapened and curling in  Objective:  Physical Exam: warm, good capillary refill, no trophic changes or ulcerative lesions, normal DP and PT pulses, normal sensory exam, and ingrowing nail right hallux lateral border discoloration yellow.  Assessment:   1. Ingrowing right great toenail      Plan:  Patient was evaluated and treated and all questions answered.    Ingrown Nail, right -Patient elects to proceed with minor surgery to remove ingrown toenail today. Consent reviewed and signed by patient. -Ingrown nail excised. See procedure note. -Educated on post-procedure care including soaking. Written instructions provided and reviewed. -Rx for Cortisporin sent to pharmacy. -Advised on signs and symptoms of infection developing.  We discussed that the phenol likely will create some redness and edema and tenderness around the nailbed as long as it is localized this is to be expected.  Will return as needed if any infection signs develop  Procedure: Excision of Ingrown Toenail Location: Right 1st toe lateral nail borders. Anesthesia: Lidocaine 1% plain; 1.5 mL and Marcaine 0.5% plain; 1.5 mL, digital block. Skin Prep: Betadine. Dressing: Silvadene; telfa; dry, sterile, compression dressing. Technique: Following skin prep, the toe was exsanguinated and a tourniquet was secured at the base of the toe. The affected nail border was freed, split with a nail splitter, and excised. Chemical matrixectomy was then performed with phenol and irrigated out with alcohol. The tourniquet was then removed and sterile  dressing applied. Disposition: Patient tolerated procedure well.    Return if symptoms worsen or fail to improve.

## 2022-08-18 DIAGNOSIS — H34832 Tributary (branch) retinal vein occlusion, left eye, with macular edema: Secondary | ICD-10-CM | POA: Diagnosis not present

## 2022-09-26 DIAGNOSIS — H35033 Hypertensive retinopathy, bilateral: Secondary | ICD-10-CM | POA: Diagnosis not present

## 2022-09-26 DIAGNOSIS — H25813 Combined forms of age-related cataract, bilateral: Secondary | ICD-10-CM | POA: Diagnosis not present

## 2022-09-26 DIAGNOSIS — H34832 Tributary (branch) retinal vein occlusion, left eye, with macular edema: Secondary | ICD-10-CM | POA: Diagnosis not present

## 2022-09-26 DIAGNOSIS — H31092 Other chorioretinal scars, left eye: Secondary | ICD-10-CM | POA: Diagnosis not present

## 2022-09-26 DIAGNOSIS — H3582 Retinal ischemia: Secondary | ICD-10-CM | POA: Diagnosis not present

## 2022-10-20 DIAGNOSIS — H34832 Tributary (branch) retinal vein occlusion, left eye, with macular edema: Secondary | ICD-10-CM | POA: Diagnosis not present

## 2022-11-30 DIAGNOSIS — H903 Sensorineural hearing loss, bilateral: Secondary | ICD-10-CM | POA: Diagnosis not present

## 2022-12-28 DIAGNOSIS — H34832 Tributary (branch) retinal vein occlusion, left eye, with macular edema: Secondary | ICD-10-CM | POA: Diagnosis not present

## 2023-01-05 DIAGNOSIS — H903 Sensorineural hearing loss, bilateral: Secondary | ICD-10-CM | POA: Diagnosis not present

## 2023-01-10 DIAGNOSIS — M109 Gout, unspecified: Secondary | ICD-10-CM | POA: Diagnosis not present

## 2023-01-10 DIAGNOSIS — H35039 Hypertensive retinopathy, unspecified eye: Secondary | ICD-10-CM | POA: Diagnosis not present

## 2023-01-10 DIAGNOSIS — J309 Allergic rhinitis, unspecified: Secondary | ICD-10-CM | POA: Diagnosis not present

## 2023-01-10 DIAGNOSIS — Z0000 Encounter for general adult medical examination without abnormal findings: Secondary | ICD-10-CM | POA: Diagnosis not present

## 2023-01-10 DIAGNOSIS — N400 Enlarged prostate without lower urinary tract symptoms: Secondary | ICD-10-CM | POA: Diagnosis not present

## 2023-01-10 DIAGNOSIS — I10 Essential (primary) hypertension: Secondary | ICD-10-CM | POA: Diagnosis not present

## 2023-01-10 DIAGNOSIS — E782 Mixed hyperlipidemia: Secondary | ICD-10-CM | POA: Diagnosis not present

## 2023-01-10 DIAGNOSIS — Z125 Encounter for screening for malignant neoplasm of prostate: Secondary | ICD-10-CM | POA: Diagnosis not present

## 2023-01-10 DIAGNOSIS — M1990 Unspecified osteoarthritis, unspecified site: Secondary | ICD-10-CM | POA: Diagnosis not present

## 2023-01-10 DIAGNOSIS — N529 Male erectile dysfunction, unspecified: Secondary | ICD-10-CM | POA: Diagnosis not present

## 2023-01-10 DIAGNOSIS — R7301 Impaired fasting glucose: Secondary | ICD-10-CM | POA: Diagnosis not present

## 2023-03-15 DIAGNOSIS — H34832 Tributary (branch) retinal vein occlusion, left eye, with macular edema: Secondary | ICD-10-CM | POA: Diagnosis not present

## 2023-03-31 DIAGNOSIS — J209 Acute bronchitis, unspecified: Secondary | ICD-10-CM | POA: Diagnosis not present

## 2023-03-31 DIAGNOSIS — R0981 Nasal congestion: Secondary | ICD-10-CM | POA: Diagnosis not present

## 2023-03-31 DIAGNOSIS — R051 Acute cough: Secondary | ICD-10-CM | POA: Diagnosis not present

## 2023-04-17 DIAGNOSIS — H35033 Hypertensive retinopathy, bilateral: Secondary | ICD-10-CM | POA: Diagnosis not present

## 2023-04-17 DIAGNOSIS — H3582 Retinal ischemia: Secondary | ICD-10-CM | POA: Diagnosis not present

## 2023-04-17 DIAGNOSIS — H34832 Tributary (branch) retinal vein occlusion, left eye, with macular edema: Secondary | ICD-10-CM | POA: Diagnosis not present

## 2023-04-17 DIAGNOSIS — H59812 Chorioretinal scars after surgery for detachment, left eye: Secondary | ICD-10-CM | POA: Diagnosis not present

## 2023-05-03 DIAGNOSIS — H34832 Tributary (branch) retinal vein occlusion, left eye, with macular edema: Secondary | ICD-10-CM | POA: Diagnosis not present

## 2023-06-16 IMAGING — RF DG ESOPHAGUS
6 series · 14 of 18 positions shown · non-contrast
Comparison: None.

CLINICAL DATA: Dysphagia. Feels like food is getting stuck in
throat.

EXAM:
ESOPHOGRAM / BARIUM SWALLOW / BARIUM TABLET STUDY
TECHNIQUE: Combined double contrast and single contrast examination performed
using effervescent crystals, thick barium liquid, and thin barium
liquid. The patient was observed with fluoroscopy swallowing a 13 mm
barium sulphate tablet.
FLUOROSCOPY TIME:  Fluoroscopy Time:  0 minute 42 second
Radiation Exposure Index (if provided by the fluoroscopic device):
Number of Acquired Spot Images: 6

[Series 1: one shot · 0.14mm/px · 1 of 1 slices shown (1 of 2)]
[im 1/1]
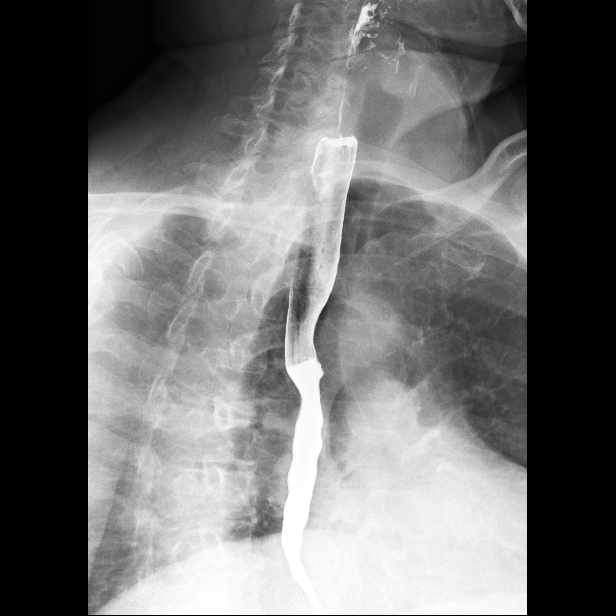

[Series 2: sequence · 0.28mm/px · 3 of 32 frames shown (1 of 4)]
[frame 5/32]
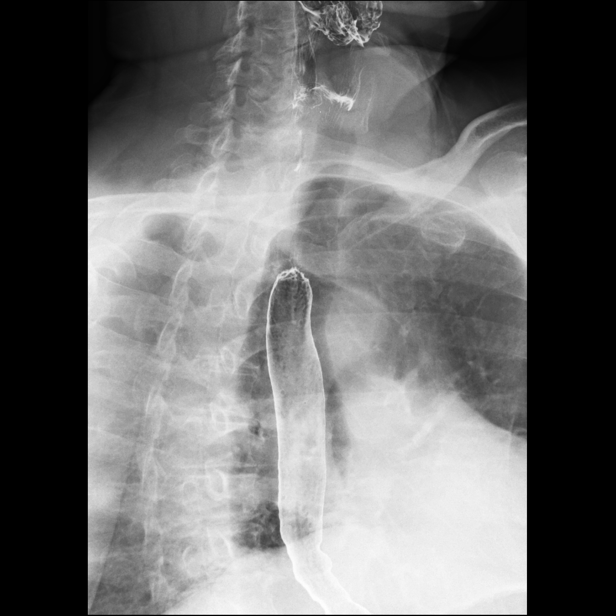
[frame 21/32]
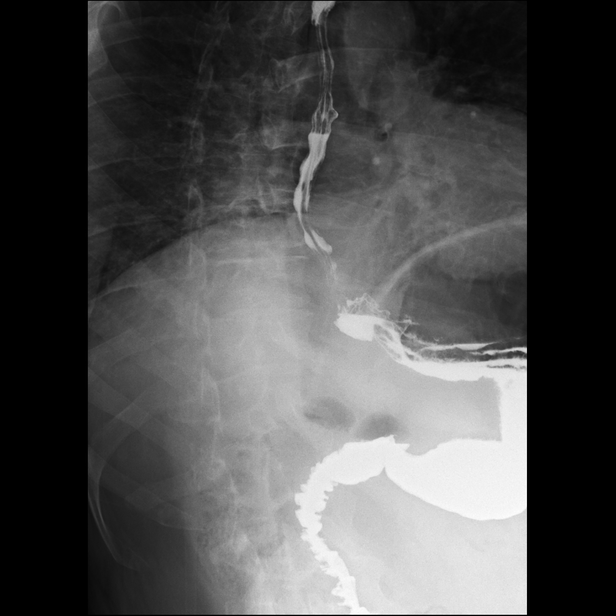
[frame 28/32]
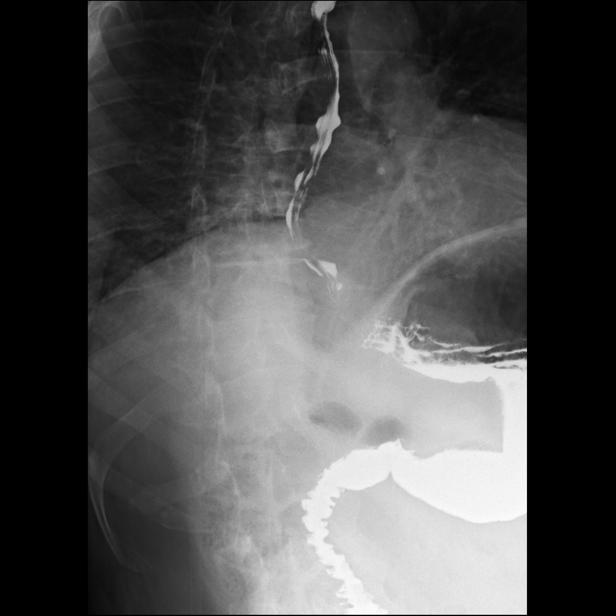

[Series 3: sequence · 0.28mm/px · 3 of 35 frames shown (2 of 4)]
[frame 6/35]
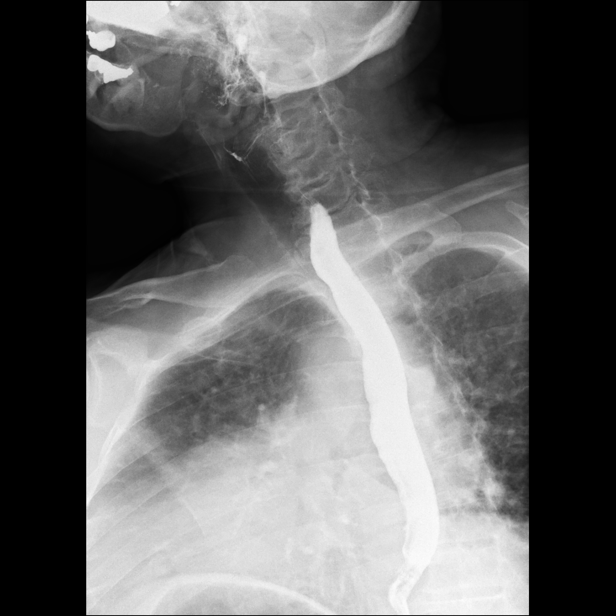
[frame 18/35]
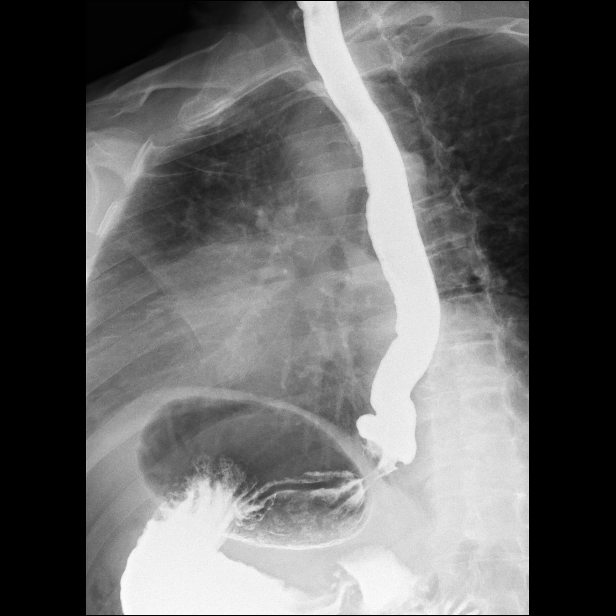
[frame 30/35]
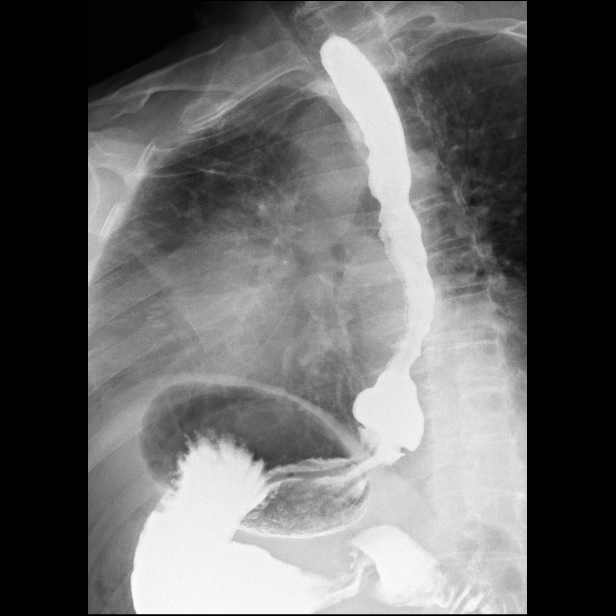

[Series 4: sequence · 0.28mm/px · 3 of 15 frames shown (3 of 4)]
[frame 3/15]
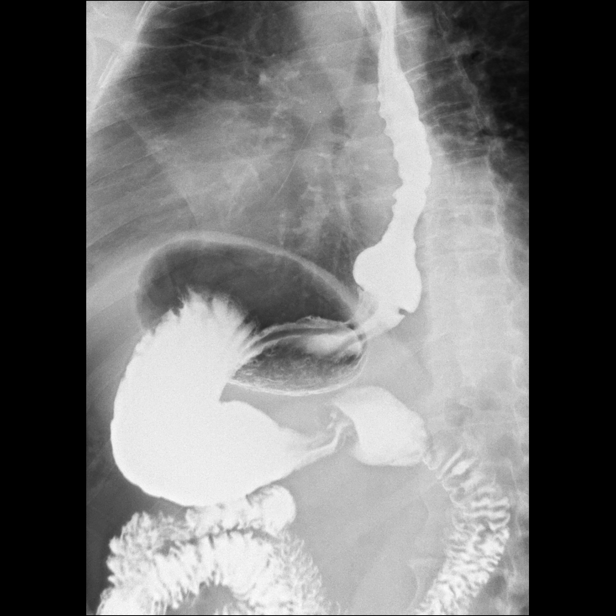
[frame 5/15]
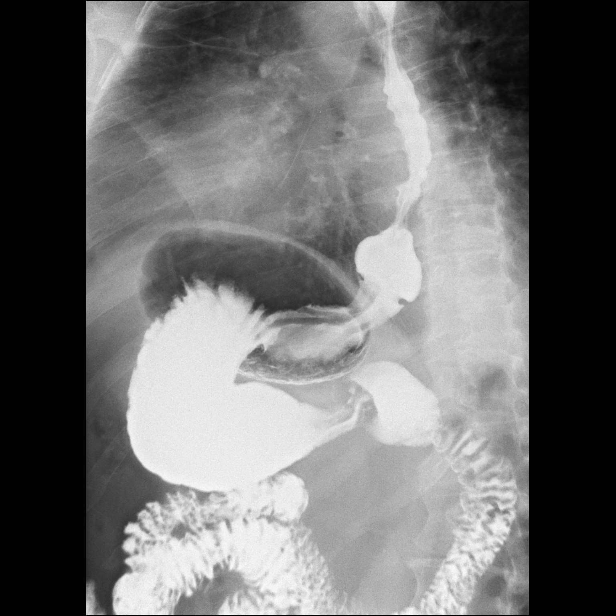
[frame 13/15]
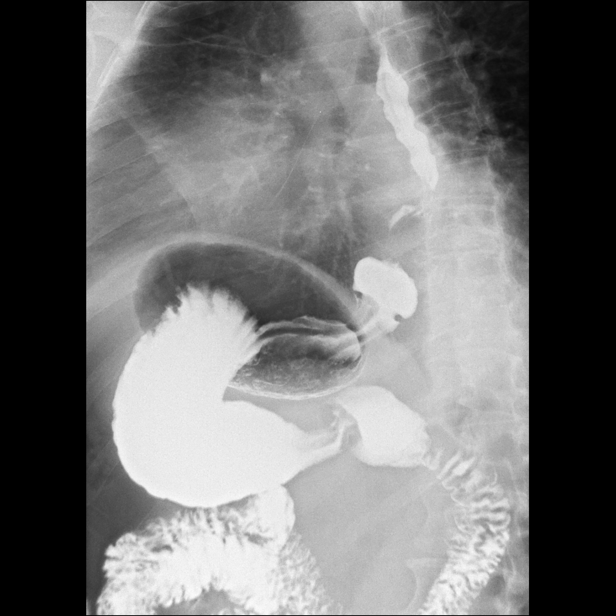

[Series 5: sequence · 3 of 18 frames shown (4 of 4)]
[frame 3/18]
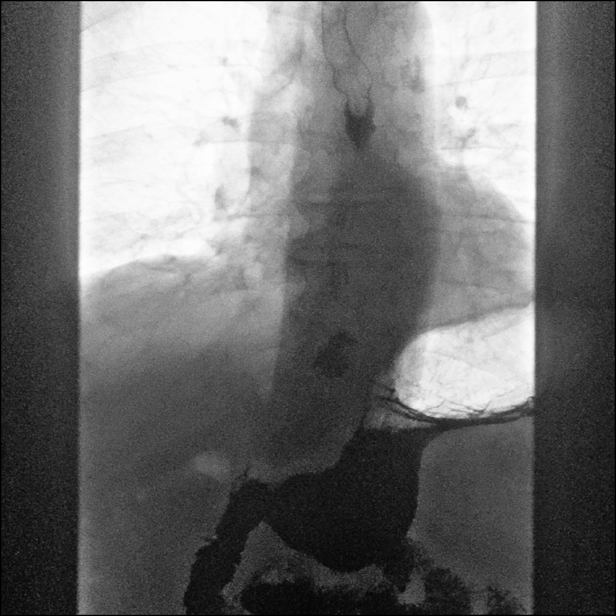
[frame 10/18]
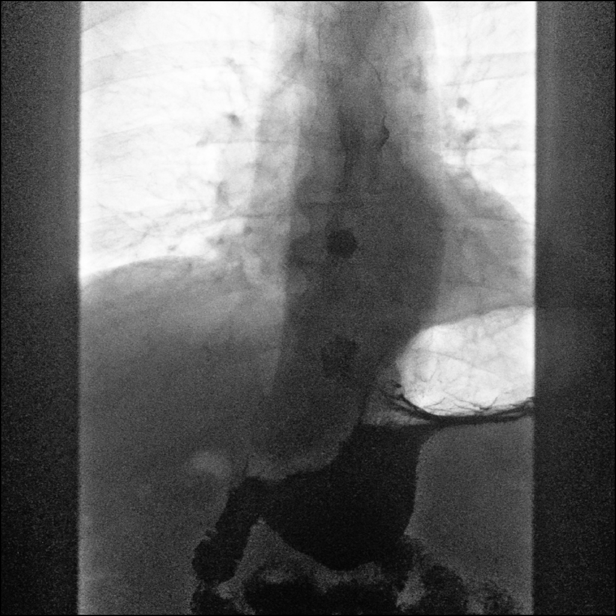
[frame 18/18]
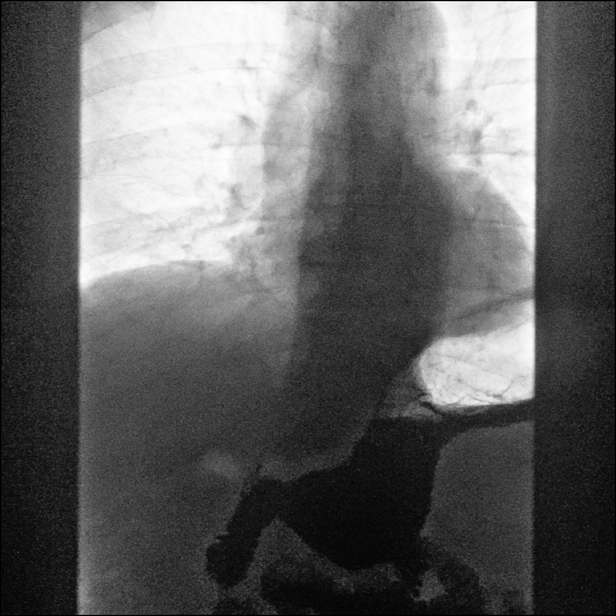

[Series 6: one shot · 1 of 1 slices shown (2 of 2)]
[im 1/1]
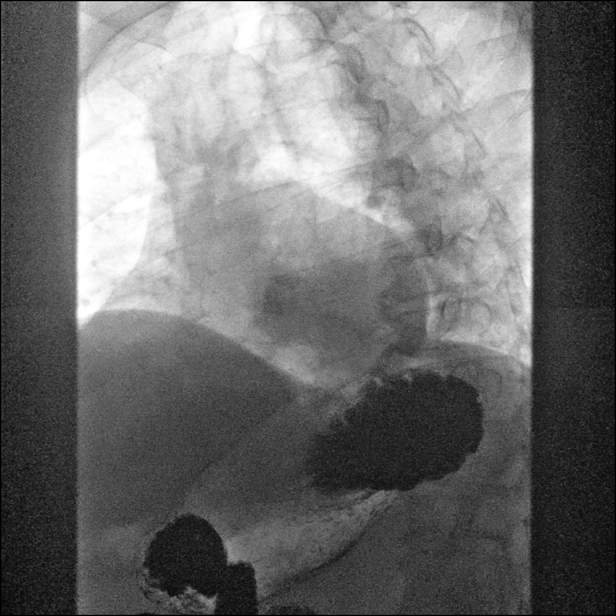

[14 of 18 positions shown; findings below may reference images not displayed]

FINDINGS: Decreased esophageal motility with uncoordinated peristalsis. No
esophageal stricture or mass.

Pharyngeal phase of swallowing normal.  No aspiration

Small hiatal hernia.  No reflux identified.

Barium tablet passed readily into the stomach without delay
IMPRESSION: Decreased esophageal motility with poorly coordinated peristalsis.

Small hiatal hernia without reflux.  No esophageal stricture.

## 2023-08-01 DIAGNOSIS — J343 Hypertrophy of nasal turbinates: Secondary | ICD-10-CM | POA: Diagnosis not present

## 2023-08-16 DIAGNOSIS — H34832 Tributary (branch) retinal vein occlusion, left eye, with macular edema: Secondary | ICD-10-CM | POA: Diagnosis not present

## 2023-09-21 DIAGNOSIS — H34832 Tributary (branch) retinal vein occlusion, left eye, with macular edema: Secondary | ICD-10-CM | POA: Diagnosis not present

## 2023-10-16 DIAGNOSIS — H25043 Posterior subcapsular polar age-related cataract, bilateral: Secondary | ICD-10-CM | POA: Diagnosis not present

## 2023-10-16 DIAGNOSIS — I10 Essential (primary) hypertension: Secondary | ICD-10-CM | POA: Diagnosis not present

## 2023-10-16 DIAGNOSIS — H35033 Hypertensive retinopathy, bilateral: Secondary | ICD-10-CM | POA: Diagnosis not present

## 2023-10-16 DIAGNOSIS — H35039 Hypertensive retinopathy, unspecified eye: Secondary | ICD-10-CM | POA: Diagnosis not present

## 2023-10-16 DIAGNOSIS — H34832 Tributary (branch) retinal vein occlusion, left eye, with macular edema: Secondary | ICD-10-CM | POA: Diagnosis not present

## 2023-11-07 DIAGNOSIS — H34832 Tributary (branch) retinal vein occlusion, left eye, with macular edema: Secondary | ICD-10-CM | POA: Diagnosis not present

## 2023-11-13 DIAGNOSIS — N400 Enlarged prostate without lower urinary tract symptoms: Secondary | ICD-10-CM | POA: Diagnosis not present

## 2023-11-13 DIAGNOSIS — E782 Mixed hyperlipidemia: Secondary | ICD-10-CM | POA: Diagnosis not present

## 2023-11-13 DIAGNOSIS — H35039 Hypertensive retinopathy, unspecified eye: Secondary | ICD-10-CM | POA: Diagnosis not present

## 2023-11-13 DIAGNOSIS — M1990 Unspecified osteoarthritis, unspecified site: Secondary | ICD-10-CM | POA: Diagnosis not present

## 2023-12-14 DIAGNOSIS — N400 Enlarged prostate without lower urinary tract symptoms: Secondary | ICD-10-CM | POA: Diagnosis not present

## 2023-12-14 DIAGNOSIS — M1990 Unspecified osteoarthritis, unspecified site: Secondary | ICD-10-CM | POA: Diagnosis not present

## 2023-12-14 DIAGNOSIS — I10 Essential (primary) hypertension: Secondary | ICD-10-CM | POA: Diagnosis not present

## 2023-12-14 DIAGNOSIS — E782 Mixed hyperlipidemia: Secondary | ICD-10-CM | POA: Diagnosis not present

## 2023-12-14 DIAGNOSIS — H35039 Hypertensive retinopathy, unspecified eye: Secondary | ICD-10-CM | POA: Diagnosis not present

## 2023-12-26 DIAGNOSIS — H34832 Tributary (branch) retinal vein occlusion, left eye, with macular edema: Secondary | ICD-10-CM | POA: Diagnosis not present

## 2024-01-11 DIAGNOSIS — E782 Mixed hyperlipidemia: Secondary | ICD-10-CM | POA: Diagnosis not present

## 2024-01-11 DIAGNOSIS — A09 Infectious gastroenteritis and colitis, unspecified: Secondary | ICD-10-CM | POA: Diagnosis not present

## 2024-01-11 DIAGNOSIS — Z0000 Encounter for general adult medical examination without abnormal findings: Secondary | ICD-10-CM | POA: Diagnosis not present

## 2024-01-11 DIAGNOSIS — Z125 Encounter for screening for malignant neoplasm of prostate: Secondary | ICD-10-CM | POA: Diagnosis not present

## 2024-01-11 DIAGNOSIS — H35039 Hypertensive retinopathy, unspecified eye: Secondary | ICD-10-CM | POA: Diagnosis not present

## 2024-01-11 DIAGNOSIS — M109 Gout, unspecified: Secondary | ICD-10-CM | POA: Diagnosis not present

## 2024-01-11 DIAGNOSIS — N529 Male erectile dysfunction, unspecified: Secondary | ICD-10-CM | POA: Diagnosis not present

## 2024-01-11 DIAGNOSIS — M1990 Unspecified osteoarthritis, unspecified site: Secondary | ICD-10-CM | POA: Diagnosis not present

## 2024-01-11 DIAGNOSIS — I10 Essential (primary) hypertension: Secondary | ICD-10-CM | POA: Diagnosis not present

## 2024-01-11 DIAGNOSIS — Z1331 Encounter for screening for depression: Secondary | ICD-10-CM | POA: Diagnosis not present

## 2024-01-11 DIAGNOSIS — J309 Allergic rhinitis, unspecified: Secondary | ICD-10-CM | POA: Diagnosis not present

## 2024-01-11 DIAGNOSIS — N400 Enlarged prostate without lower urinary tract symptoms: Secondary | ICD-10-CM | POA: Diagnosis not present

## 2024-01-11 DIAGNOSIS — R7301 Impaired fasting glucose: Secondary | ICD-10-CM | POA: Diagnosis not present

## 2024-01-14 DIAGNOSIS — E782 Mixed hyperlipidemia: Secondary | ICD-10-CM | POA: Diagnosis not present

## 2024-01-14 DIAGNOSIS — H35039 Hypertensive retinopathy, unspecified eye: Secondary | ICD-10-CM | POA: Diagnosis not present

## 2024-01-14 DIAGNOSIS — N400 Enlarged prostate without lower urinary tract symptoms: Secondary | ICD-10-CM | POA: Diagnosis not present

## 2024-01-14 DIAGNOSIS — I10 Essential (primary) hypertension: Secondary | ICD-10-CM | POA: Diagnosis not present

## 2024-01-14 DIAGNOSIS — M1990 Unspecified osteoarthritis, unspecified site: Secondary | ICD-10-CM | POA: Diagnosis not present

## 2024-02-13 DIAGNOSIS — H34832 Tributary (branch) retinal vein occlusion, left eye, with macular edema: Secondary | ICD-10-CM | POA: Diagnosis not present

## 2024-03-25 DIAGNOSIS — H34832 Tributary (branch) retinal vein occlusion, left eye, with macular edema: Secondary | ICD-10-CM | POA: Diagnosis not present

## 2024-04-18 DIAGNOSIS — H34832 Tributary (branch) retinal vein occlusion, left eye, with macular edema: Secondary | ICD-10-CM | POA: Diagnosis not present

## 2024-04-18 DIAGNOSIS — H2513 Age-related nuclear cataract, bilateral: Secondary | ICD-10-CM | POA: Diagnosis not present

## 2024-04-18 DIAGNOSIS — H59812 Chorioretinal scars after surgery for detachment, left eye: Secondary | ICD-10-CM | POA: Diagnosis not present

## 2024-04-18 DIAGNOSIS — H35033 Hypertensive retinopathy, bilateral: Secondary | ICD-10-CM | POA: Diagnosis not present
